# Patient Record
Sex: Male | Born: 1950 | Race: White | Hispanic: No | Smoking: Current every day smoker
Health system: Southern US, Community
[De-identification: ages and names within clinical notes are randomized; demographics above are authoritative.]

## PROBLEM LIST (undated history)

## (undated) DIAGNOSIS — F419 Anxiety disorder, unspecified: Secondary | ICD-10-CM

## (undated) DIAGNOSIS — G43909 Migraine, unspecified, not intractable, without status migrainosus: Secondary | ICD-10-CM

## (undated) DIAGNOSIS — M1990 Unspecified osteoarthritis, unspecified site: Secondary | ICD-10-CM

## (undated) DIAGNOSIS — E7800 Pure hypercholesterolemia, unspecified: Secondary | ICD-10-CM

---

## 2009-09-15 ENCOUNTER — Ambulatory Visit (HOSPITAL_COMMUNITY): Admission: RE | Admit: 2009-09-15 | Discharge: 2009-09-15 | Payer: Self-pay | Admitting: Family Medicine

## 2010-04-18 ENCOUNTER — Ambulatory Visit (HOSPITAL_COMMUNITY): Admission: RE | Admit: 2010-04-18 | Discharge: 2010-04-18 | Payer: Self-pay | Admitting: Urology

## 2010-04-28 ENCOUNTER — Ambulatory Visit (HOSPITAL_COMMUNITY)
Admission: RE | Admit: 2010-04-28 | Discharge: 2010-04-28 | Payer: Self-pay | Source: Home / Self Care | Admitting: Urology

## 2010-06-11 ENCOUNTER — Encounter: Payer: Self-pay | Admitting: Urology

## 2010-07-06 ENCOUNTER — Emergency Department (HOSPITAL_COMMUNITY)
Admission: EM | Admit: 2010-07-06 | Discharge: 2010-07-06 | Disposition: A | Discharge status: Home / Self Care | Payer: Medicare Other | Attending: Emergency Medicine | Admitting: Emergency Medicine

## 2010-07-06 DIAGNOSIS — Z79899 Other long term (current) drug therapy: Secondary | ICD-10-CM | POA: Insufficient documentation

## 2010-07-06 DIAGNOSIS — R000 Tachycardia, unspecified: Secondary | ICD-10-CM | POA: Insufficient documentation

## 2010-07-06 DIAGNOSIS — R339 Retention of urine, unspecified: Secondary | ICD-10-CM | POA: Insufficient documentation

## 2010-07-06 DIAGNOSIS — E7800 Pure hypercholesterolemia, unspecified: Secondary | ICD-10-CM | POA: Insufficient documentation

## 2010-07-06 LAB — URINALYSIS, ROUTINE W REFLEX MICROSCOPIC
Bilirubin Urine: NEGATIVE
Ketones, ur: NEGATIVE mg/dL
Nitrite: NEGATIVE
Urobilinogen, UA: 0.2 mg/dL (ref 0.0–1.0)

## 2010-08-08 ENCOUNTER — Encounter (HOSPITAL_COMMUNITY): Payer: Self-pay

## 2010-08-08 ENCOUNTER — Ambulatory Visit (HOSPITAL_COMMUNITY)
Admission: RE | Admit: 2010-08-08 | Discharge: 2010-08-08 | Disposition: A | Discharge status: Home / Self Care | Payer: Medicare Other | Source: Ambulatory Visit | Attending: Urology | Admitting: Urology

## 2010-08-08 ENCOUNTER — Other Ambulatory Visit (HOSPITAL_COMMUNITY): Payer: Self-pay | Admitting: Urology

## 2010-08-08 DIAGNOSIS — N200 Calculus of kidney: Secondary | ICD-10-CM | POA: Insufficient documentation

## 2010-08-08 DIAGNOSIS — R109 Unspecified abdominal pain: Secondary | ICD-10-CM | POA: Insufficient documentation

## 2011-01-25 ENCOUNTER — Other Ambulatory Visit (HOSPITAL_COMMUNITY): Payer: Self-pay | Admitting: Urology

## 2011-01-25 ENCOUNTER — Ambulatory Visit (HOSPITAL_COMMUNITY)
Admission: RE | Admit: 2011-01-25 | Discharge: 2011-01-25 | Disposition: A | Discharge status: Home / Self Care | Payer: Medicare Other | Source: Ambulatory Visit | Attending: Urology | Admitting: Urology

## 2011-01-25 DIAGNOSIS — N200 Calculus of kidney: Secondary | ICD-10-CM

## 2011-01-25 DIAGNOSIS — R109 Unspecified abdominal pain: Secondary | ICD-10-CM | POA: Insufficient documentation

## 2011-02-24 ENCOUNTER — Ambulatory Visit (HOSPITAL_COMMUNITY): Payer: Medicare Other

## 2011-08-02 ENCOUNTER — Other Ambulatory Visit (HOSPITAL_COMMUNITY): Payer: Self-pay | Admitting: Urology

## 2011-08-02 DIAGNOSIS — N200 Calculus of kidney: Secondary | ICD-10-CM

## 2011-08-08 ENCOUNTER — Ambulatory Visit (HOSPITAL_COMMUNITY): Payer: Medicare Other

## 2011-10-17 ENCOUNTER — Ambulatory Visit (HOSPITAL_COMMUNITY): Payer: Medicare Other

## 2011-10-27 ENCOUNTER — Ambulatory Visit (HOSPITAL_COMMUNITY): Payer: Medicare Other

## 2011-11-03 ENCOUNTER — Ambulatory Visit (HOSPITAL_COMMUNITY): Payer: Medicare Other

## 2011-11-08 DIAGNOSIS — R000 Tachycardia, unspecified: Secondary | ICD-10-CM

## 2011-11-17 ENCOUNTER — Ambulatory Visit (HOSPITAL_COMMUNITY): Payer: Medicare Other | Attending: Urology

## 2012-09-04 ENCOUNTER — Encounter (HOSPITAL_COMMUNITY): Payer: Self-pay | Admitting: Emergency Medicine

## 2012-09-04 ENCOUNTER — Emergency Department (HOSPITAL_COMMUNITY)
Admission: EM | Admit: 2012-09-04 | Discharge: 2012-09-04 | Disposition: A | Discharge status: Home / Self Care | Payer: Medicare Other | Attending: Emergency Medicine | Admitting: Emergency Medicine

## 2012-09-04 DIAGNOSIS — IMO0002 Reserved for concepts with insufficient information to code with codable children: Secondary | ICD-10-CM | POA: Insufficient documentation

## 2012-09-04 DIAGNOSIS — Y9389 Activity, other specified: Secondary | ICD-10-CM | POA: Insufficient documentation

## 2012-09-04 DIAGNOSIS — Y929 Unspecified place or not applicable: Secondary | ICD-10-CM | POA: Insufficient documentation

## 2012-09-04 DIAGNOSIS — H9209 Otalgia, unspecified ear: Secondary | ICD-10-CM | POA: Insufficient documentation

## 2012-09-04 DIAGNOSIS — T169XXA Foreign body in ear, unspecified ear, initial encounter: Secondary | ICD-10-CM | POA: Insufficient documentation

## 2012-09-04 DIAGNOSIS — F17200 Nicotine dependence, unspecified, uncomplicated: Secondary | ICD-10-CM | POA: Insufficient documentation

## 2012-09-04 DIAGNOSIS — M129 Arthropathy, unspecified: Secondary | ICD-10-CM | POA: Insufficient documentation

## 2012-09-04 DIAGNOSIS — S00452A Superficial foreign body of left ear, initial encounter: Secondary | ICD-10-CM

## 2012-09-04 DIAGNOSIS — E7800 Pure hypercholesterolemia, unspecified: Secondary | ICD-10-CM | POA: Insufficient documentation

## 2012-09-04 DIAGNOSIS — Z8679 Personal history of other diseases of the circulatory system: Secondary | ICD-10-CM | POA: Insufficient documentation

## 2012-09-04 DIAGNOSIS — F411 Generalized anxiety disorder: Secondary | ICD-10-CM | POA: Insufficient documentation

## 2012-09-04 HISTORY — DX: Unspecified osteoarthritis, unspecified site: M19.90

## 2012-09-04 HISTORY — DX: Anxiety disorder, unspecified: F41.9

## 2012-09-04 HISTORY — DX: Pure hypercholesterolemia, unspecified: E78.00

## 2012-09-04 HISTORY — DX: Migraine, unspecified, not intractable, without status migrainosus: G43.909

## 2012-09-04 NOTE — ED Provider Notes (Signed)
History     CSN: [id number]  Arrival date & time 09/04/12  2015   First MD Initiated Contact with Patient 09/04/12 2036      Chief Complaint  Patient presents with  . Foreign Body in Ear    (Consider location/radiation/quality/duration/timing/severity/associated sxs/prior treatment) HPI Comments: Patient complains of pain to his left ear after he was cleaning his ears with a Q-tip and cotton in came off in his ear. He also reports bleeding to his ear canal that began after he tried to remove the cotton himself using a pair of tweezers.  He c/o pressure in his ear with decreasing hearing.  He denies dizziness, headache, neck pain, or vomiting  Patient is a 62 y.o. male presenting with foreign body in ear. The history is provided by the patient.  Foreign Body in Ear This is a new problem. The current episode started today. The problem occurs constantly. The problem has been unchanged. Pertinent negatives include no congestion, fever, headaches, neck pain, numbness, sore throat, vomiting or weakness. Associated symptoms comments: No dizziness. Nothing aggravates the symptoms. Treatments tried: patient tried removing the cotton with a pair to tweezers. The treatment provided no relief.    Past Medical History  Diagnosis Date  . Migraines   . Arthritis   . Anxiety   . Hypercholesteremia     History reviewed. No pertinent past surgical history.  History reviewed. No pertinent family history.  History  Substance Use Topics  . Smoking status: Current Every Day Smoker  . Smokeless tobacco: Not on file  . Alcohol Use: No      Review of Systems  Constitutional: Negative for fever, activity change and appetite change.  HENT: Positive for ear pain. Negative for congestion, sore throat, trouble swallowing, neck pain and tinnitus.   Gastrointestinal: Negative for vomiting.  Neurological: Negative for dizziness, facial asymmetry, weakness, light-headedness, numbness and headaches.   All other systems reviewed and are negative.    Allergies  Doxycycline and Tetracyclines & related  Home Medications  No current outpatient prescriptions on file.  BP 129/79  Pulse 105  Temp(Src) 98.4 F (36.9 C) (Oral)  Resp 16  Ht 5\' 11"  (1.803 m)  Wt 198 lb (89.812 kg)  BMI 27.63 kg/m2  SpO2 95%  Physical Exam  Nursing note and vitals reviewed. Constitutional: He is oriented to person, place, and time. He appears well-developed and well-nourished. No distress.  HENT:  Head: Normocephalic.  Right Ear: Tympanic membrane and ear canal normal.  Left Ear: There is tenderness. No swelling. A foreign body is present. Tympanic membrane is not perforated.  Dried blood to the left ear canal with small foreign body within the canal.    Neck: Normal range of motion. Neck supple.  Cardiovascular: Normal rate, regular rhythm, normal heart sounds and intact distal pulses.   No murmur heard. Pulmonary/Chest: Effort normal and breath sounds normal. No respiratory distress.  Musculoskeletal: Normal range of motion.  Lymphadenopathy:    He has no cervical adenopathy.  Neurological: He is alert and oriented to person, place, and time. He exhibits normal muscle tone. Coordination normal.  Skin: Skin is warm and dry.    ED Course  FOREIGN BODY REMOVAL Date/Time: 09/04/2012 8:40 PM Performed by: Trisha Mangle, Lynnlee Revels L. Authorized by: Maxwell Caul Consent: Verbal consent obtained. Risks and benefits: risks, benefits and alternatives were discussed Consent given by: patient Patient identity confirmed: arm band and verbally with patient Time out: Immediately prior to procedure a "time out" was called  to verify the correct patient, procedure, equipment, support staff and site/side marked as required. Body area: ear Location details: left ear Anesthesia method: none. Patient sedated: no Patient restrained: no Patient cooperative: yes Localization method: visualized and  magnification Removal mechanism: alligator forceps Complexity: simple 1 objects recovered. Objects recovered: piece of cotton Post-procedure assessment: foreign body removed Patient tolerance: Patient tolerated the procedure well with no immediate complications.   (including critical care time)  Labs Reviewed - No data to display No results found.   1. Foreign body in ear lobe, left, initial encounter       MDM   Left ear re-evaluated after FB removal, TM is intact.  Dried blood localized to the ear canal only.  Pain improved.  Advised patient not to put anything into his ears.  He agrees to f/u with his PMD.     The patient appears reasonably screened and/or stabilized for discharge and I doubt any other medical condition or other [facility] requiring further screening, evaluation, or treatment in the ED at this time prior to discharge.     Kimberlly Norgard L. Trisha Mangle, PA-C 09/04/12 2112

## 2012-09-04 NOTE — ED Notes (Signed)
Pt states he was cleaning his ears with qtips and the cotton came off in his left ear.

## 2012-09-04 NOTE — ED Provider Notes (Signed)
Medical screening examination/treatment/procedure(s) were performed by non-physician practitioner and as supervising physician I was immediately available for consultation/collaboration.   Jalynne Persico L Quintasha Gren, MD 09/04/12 2318 

## 2013-07-15 ENCOUNTER — Other Ambulatory Visit (HOSPITAL_COMMUNITY): Payer: Self-pay

## 2013-07-15 DIAGNOSIS — J441 Chronic obstructive pulmonary disease with (acute) exacerbation: Secondary | ICD-10-CM

## 2013-07-29 ENCOUNTER — Ambulatory Visit (HOSPITAL_COMMUNITY)
Admission: RE | Admit: 2013-07-29 | Discharge: 2013-07-29 | Disposition: A | Discharge status: Home / Self Care | Payer: Medicare Other | Source: Ambulatory Visit | Attending: Pulmonary Disease | Admitting: Pulmonary Disease

## 2013-07-29 ENCOUNTER — Other Ambulatory Visit (HOSPITAL_COMMUNITY): Payer: Self-pay | Admitting: Pulmonary Disease

## 2013-07-29 DIAGNOSIS — J984 Other disorders of lung: Secondary | ICD-10-CM | POA: Insufficient documentation

## 2013-07-29 DIAGNOSIS — R0602 Shortness of breath: Secondary | ICD-10-CM

## 2013-07-29 DIAGNOSIS — J441 Chronic obstructive pulmonary disease with (acute) exacerbation: Secondary | ICD-10-CM | POA: Insufficient documentation

## 2013-07-29 LAB — PULMONARY FUNCTION TEST
DL/VA % pred: 57 %
DL/VA: 2.7 ml/min/mmHg/L
DLCO COR % PRED: 55 %
DLCO cor: 19.5 ml/min/mmHg
DLCO unc % pred: 55 %
DLCO unc: 19.5 ml/min/mmHg
FEF 25-75 POST: 3.02 L/s
FEF 25-75 Pre: 2.12 L/sec
FEF2575-%CHANGE-POST: 42 %
FEF2575-%PRED-PRE: 68 %
FEF2575-%Pred-Post: 97 %
FEV1-%Change-Post: 8 %
FEV1-%PRED-POST: 75 %
FEV1-%PRED-PRE: 68 %
FEV1-POST: 2.88 L
FEV1-Pre: 2.64 L
FEV1FVC-%CHANGE-POST: 2 %
FEV1FVC-%Pred-Pre: 102 %
FEV6-%CHANGE-POST: 7 %
FEV6-%Pred-Post: 74 %
FEV6-%Pred-Pre: 69 %
FEV6-PRE: 3.36 L
FEV6-Post: 3.63 L
FEV6FVC-%Change-Post: 1 %
FEV6FVC-%Pred-Post: 104 %
FEV6FVC-%Pred-Pre: 102 %
FVC-%Change-Post: 6 %
FVC-%PRED-POST: 72 %
FVC-%Pred-Pre: 68 %
FVC-Post: 3.67 L
FVC-Pre: 3.45 L
POST FEV1/FVC RATIO: 78 %
POST FEV6/FVC RATIO: 99 %
Pre FEV1/FVC ratio: 77 %
Pre FEV6/FVC Ratio: 97 %
RV % pred: 109 %
RV: 2.61 L
TLC % pred: 82 %
TLC: 6.09 L

## 2013-07-29 MED ORDER — ALBUTEROL SULFATE (2.5 MG/3ML) 0.083% IN NEBU
2.5000 mg | INHALATION_SOLUTION | Freq: Once | RESPIRATORY_TRACT | Status: AC
  Administered 2013-07-29: 2.5 mg via RESPIRATORY_TRACT

## 2013-08-06 NOTE — Procedures (Signed)
NAMETYRELLE, RACZKA NO.:  [id number]  MEDICAL RECORD NO.:  [id number]  LOCATION:                                 FACILITY:  PHYSICIAN:  Feleica Fulmore L. Luan Pulling, M.D.DATE OF BIRTH:  May 16, 1951  DATE OF PROCEDURE:  08/05/2013 DATE OF DISCHARGE:                           PULMONARY FUNCTION TEST   REASON FOR PULMONARY FUNCTION TESTING:  COPD exacerbation.  1. Spirometry shows a mild ventilatory defect with airflow obstruction     at the level of the smaller airways. 2. Lung volumes are normal. 3. Airway resistance is slightly elevated confirming the presence of     airflow obstruction. 4. DLCO is moderately reduced. 5. This study is consistent with the clinical diagnosis of COPD.     Dede Dobesh L. Luan Pulling, M.D.     ELH/MEDQ  D:  08/05/2013  T:  08/06/2013  Job:  [id number]

## 2013-08-08 ENCOUNTER — Encounter (HOSPITAL_COMMUNITY): Payer: Self-pay | Admitting: Emergency Medicine

## 2013-08-08 ENCOUNTER — Emergency Department (HOSPITAL_COMMUNITY)
Admission: EM | Admit: 2013-08-08 | Discharge: 2013-08-08 | Disposition: A | Discharge status: Home / Self Care | Payer: Medicare Other | Attending: Emergency Medicine | Admitting: Emergency Medicine

## 2013-08-08 ENCOUNTER — Emergency Department (HOSPITAL_COMMUNITY): Payer: Medicare Other

## 2013-08-08 DIAGNOSIS — Y9302 Activity, running: Secondary | ICD-10-CM | POA: Insufficient documentation

## 2013-08-08 DIAGNOSIS — IMO0002 Reserved for concepts with insufficient information to code with codable children: Secondary | ICD-10-CM | POA: Insufficient documentation

## 2013-08-08 DIAGNOSIS — Y929 Unspecified place or not applicable: Secondary | ICD-10-CM | POA: Insufficient documentation

## 2013-08-08 DIAGNOSIS — F17200 Nicotine dependence, unspecified, uncomplicated: Secondary | ICD-10-CM | POA: Insufficient documentation

## 2013-08-08 DIAGNOSIS — IMO0001 Reserved for inherently not codable concepts without codable children: Secondary | ICD-10-CM

## 2013-08-08 DIAGNOSIS — M79609 Pain in unspecified limb: Secondary | ICD-10-CM | POA: Insufficient documentation

## 2013-08-08 DIAGNOSIS — Z79899 Other long term (current) drug therapy: Secondary | ICD-10-CM | POA: Insufficient documentation

## 2013-08-08 DIAGNOSIS — F1310 Sedative, hypnotic or anxiolytic abuse, uncomplicated: Secondary | ICD-10-CM | POA: Insufficient documentation

## 2013-08-08 DIAGNOSIS — E7800 Pure hypercholesterolemia, unspecified: Secondary | ICD-10-CM | POA: Insufficient documentation

## 2013-08-08 DIAGNOSIS — G43909 Migraine, unspecified, not intractable, without status migrainosus: Secondary | ICD-10-CM | POA: Insufficient documentation

## 2013-08-08 DIAGNOSIS — Z8739 Personal history of other diseases of the musculoskeletal system and connective tissue: Secondary | ICD-10-CM | POA: Insufficient documentation

## 2013-08-08 DIAGNOSIS — F411 Generalized anxiety disorder: Secondary | ICD-10-CM | POA: Insufficient documentation

## 2013-08-08 DIAGNOSIS — W010XXA Fall on same level from slipping, tripping and stumbling without subsequent striking against object, initial encounter: Secondary | ICD-10-CM | POA: Insufficient documentation

## 2013-08-08 MED ORDER — ETODOLAC 200 MG PO CAPS: 200.0000 mg | ORAL_CAPSULE | Freq: Three times a day (TID) | ORAL | Status: DC

## 2013-08-08 NOTE — ED Provider Notes (Addendum)
CSN: [id number]     Arrival date & time 08/08/13  0867 History   This chart was scribed for Orpah Greek, * by Era Bumpers, ED scribe. This patient was seen in room APA10/APA10 and the patient's care was started at 0943.  Chief Complaint  Patient presents with  . Leg Pain   The history is provided by the patient. No language interpreter was used.   HPI Comments: Donald Proctor is a 63 y.o. male who presents to the Emergency Department complaining of posterior left thigh pain which radiates down his left calf, onset 3 days ago, which he attributes to a trip/fall while running x4 days ago. He states his pain worsens while bending his knee and states "the muscles feel like they are pulling." He states noticed some pain when the fall/trip occurred but it worsened when he woke up the next morning. States a small abrasion to his right knee. He denies any back/neck pain or ankle pain. He denies SOB and did not have LOC with his trip/fall.   Past Medical History  Diagnosis Date  . Migraines   . Arthritis   . Anxiety   . Hypercholesteremia    History reviewed. No pertinent past surgical history. No family history on file. History  Substance Use Topics  . Smoking status: Current Every Day Smoker  . Smokeless tobacco: Not on file  . Alcohol Use: No    Review of Systems  Constitutional: Negative for fever and chills.  Respiratory: Negative for cough and shortness of breath.   Cardiovascular: Negative for chest pain.  Gastrointestinal: Negative for nausea and vomiting.  Musculoskeletal: Negative for back pain.       Pain left posterior thigh and left posterior lower leg  Skin: Negative for rash.  All other systems reviewed and are negative.    Allergies  Doxycycline; Lipitor; and Tetracyclines & related  Home Medications   Current Outpatient Rx  Name  Route  Sig  Dispense  Refill  . ALPRAZolam (XANAX) 1 MG tablet   Oral   Take 1 mg by mouth 3 (three) times daily.          Marland Kitchen amitriptyline (ELAVIL) 50 MG tablet   Oral   Take 50 mg by mouth 3 (three) times daily.         . budesonide-formoterol (SYMBICORT) 160-4.5 MCG/ACT inhaler   Inhalation   Inhale 2 puffs into the lungs 2 (two) times daily.         . chlordiazePOXIDE (LIBRIUM) 25 MG capsule   Oral   Take 25 mg by mouth 3 (three) times daily.         . CRESTOR 10 MG tablet   Oral   Take 10 mg by mouth daily.         . metoprolol succinate (TOPROL-XL) 25 MG 24 hr tablet   Oral   Take 12.5 mg by mouth daily.         Marland Kitchen PROAIR HFA 108 (90 BASE) MCG/ACT inhaler   Inhalation   Inhale 2 puffs into the lungs 4 (four) times daily as needed.         . tamsulosin (FLOMAX) 0.4 MG CAPS capsule   Oral   Take 0.4 mg by mouth at bedtime.         Marland Kitchen tiotropium (SPIRIVA) 18 MCG inhalation capsule   Inhalation   Place 18 mcg into inhaler and inhale daily.         Marland Kitchen etodolac (LODINE) 200  MG capsule   Oral   Take 1 capsule (200 mg total) by mouth 3 (three) times daily.   20 capsule   0     Triage Vitals: BP 125/65  Pulse 95  Temp(Src) 98.1 F (36.7 C)  Resp 18  Ht 5\' 11"  (1.803 m)  Wt 210 lb (95.255 kg)  BMI 29.30 kg/m2  SpO2 94%  Physical Exam  Nursing note and vitals reviewed. Constitutional: He is oriented to person, place, and time. He appears well-developed and well-nourished. No distress.  HENT:  Head: Normocephalic and atraumatic.  Eyes: Conjunctivae are normal. Right eye exhibits no discharge. Left eye exhibits no discharge.  Neck: Normal range of motion.  Cardiovascular: Normal rate, regular rhythm and normal heart sounds.   Pulses:      Dorsalis pedis pulses are 2+ on the right side, and 2+ on the left side.  Pulmonary/Chest: Effort normal and breath sounds normal. No respiratory distress. He has no wheezes. He has no rales.  Musculoskeletal: Normal range of motion. He exhibits no edema.       Left hip: Normal. He exhibits normal range of motion, no tenderness and  no bony tenderness.       Left knee: Normal. He exhibits normal range of motion, no swelling, no effusion and no deformity. No tenderness found.       Left ankle: He exhibits normal range of motion and no swelling. No tenderness.       Lumbar back: Normal. He exhibits normal range of motion, no tenderness and no bony tenderness.       Left foot: Normal.  Neurological: He is alert and oriented to person, place, and time.  Skin: Skin is warm and dry.  Psychiatric: He has a normal mood and affect. Thought content normal.    ED Course  Procedures (including critical care time) DIAGNOSTIC STUDIES: Oxygen Saturation is 94% on room air, adequate by my interpretation.    COORDINATION OF CARE: At 1000 AM Discussed treatment plan with patient which includes [place] LLE. Patient agrees.   Labs Review Labs Reviewed - No data to display Imaging Review US Venous Img Lower Unilateral Left  08/08/2013   CLINICAL DATA:  Left leg pain and swelling following injury  EXAM: Left LOWER EXTREMITY VENOUS DOPPLER ULTRASOUND  TECHNIQUE: Gray-scale sonography with graded compression, as well as color Doppler and duplex ultrasound, were performed to evaluate the deep venous system from the level of the common femoral vein through the popliteal and proximal calf veins. Spectral Doppler was utilized to evaluate flow at rest and with distal augmentation maneuvers.  COMPARISON:  None.  FINDINGS: Thrombus within deep veins:  None visualized.  Compressibility of deep veins:  Normal.  Duplex waveform respiratory phasicity:  Normal.  Duplex waveform response to augmentation:  Normal.  Venous reflux:  None visualized.  Other findings:  None visualized.  IMPRESSION: No evidence of deep venous thrombosis is noted in the left leg.   Electronically Signed   By: Inez Catalina M.D.   On: 08/08/2013 11:13     EKG Interpretation None      MDM   Final diagnoses:  Strain    The patient presented several days after a fall with  complaints of pain in the entire left leg. He has not experienced any back pain. This does not appear to be a radiculopathy. Area of pain is in the hamstring area as well as calf area. Careful palpation of the left hip, left knee, left ankle and  foot reveal no bony tenderness. There is no swelling or deformity. He has normal strength and sensation. Pulses normal. Ultrasound is performed to rule out DVT. No DVT was seen. Patient seemed to feel like he required x-rays of his entire leg. As there was no bony tenderness and he is able to ambulate, I did not feel like this was necessary. She became quite agitated I did not do the x-ray and Aleve. He was pacing around the ER stating "I shouldn't have done more head". He was given discharge instructions for muscle strain. He can followup with his primary care physician, Doctor Bluth.  I personally performed the services described in this documentation, which was scribed in my presence. The recorded information has been reviewed and is accurate.     Orpah Greek, MD 08/08/13 [place], MD 08/08/13 1537

## 2013-08-08 NOTE — ED Notes (Signed)
Pt upset at discharge stating, "I should have went to [facility]."  Pt feels like nothing was done for him.  Discharge instructions reviewed and rx reviewed.  Pt initially refused instructions and rx but after encouragement, pt took papers and instructions.  Pt did sign e-sig but signature pad was not working at that time.  Pt offered wheelchair but refused.

## 2013-08-08 NOTE — ED Notes (Signed)
Pt c/o lle pain since tripping and falling in home on Tuesday.

## 2013-08-08 NOTE — Discharge Instructions (Signed)
Muscle Strain  A muscle strain (pulled muscle) happens when a muscle is stretched beyond normal length. It happens when a sudden, violent force stretches your muscle too far. Usually, a few of the fibers in your muscle are torn. Muscle strain is common in athletes. Recovery usually takes 1 2 weeks. Complete healing takes 5 6 weeks.   HOME CARE    Follow the PRICE method of treatment to help your injury get better. Do this the first 2 3 days after the injury:   Protect. Protect the muscle to keep it from getting injured again.   Rest. Limit your activity and rest the injured body part.   Ice. Put ice in a plastic bag. Place a towel between your skin and the bag. Then, apply the ice and leave it on from 15 20 minutes each hour. After the third day, switch to moist heat packs.   Compression. Use a splint or elastic bandage on the injured area for comfort. Do not put it on too tightly.   Elevate. Keep the injured body part above the level of your heart.   Only take medicine as told by your doctor.   Warm up before doing exercise to prevent future muscle strains.  GET HELP IF:    You have more pain or puffiness (swelling) in the injured area.   You feel numbness, tingling, or notice a loss of strength in the injured area.  MAKE SURE YOU:    Understand these instructions.   Will watch your condition.   Will get help right away if you are not doing well or get worse.  Document Released: 02/15/2008 Document Revised: 02/26/2013 Document Reviewed: 12/05/2012  [employer] Patient Information 2014 [employer]

## 2013-11-18 ENCOUNTER — Other Ambulatory Visit (HOSPITAL_COMMUNITY): Payer: Self-pay | Admitting: Pulmonary Disease

## 2013-11-18 DIAGNOSIS — R918 Other nonspecific abnormal finding of lung field: Secondary | ICD-10-CM

## 2013-12-03 ENCOUNTER — Ambulatory Visit (HOSPITAL_COMMUNITY)
Admission: RE | Admit: 2013-12-03 | Discharge: 2013-12-03 | Disposition: A | Discharge status: Home / Self Care | Payer: Medicare Other | Source: Ambulatory Visit | Attending: Pulmonary Disease | Admitting: Pulmonary Disease

## 2013-12-03 ENCOUNTER — Encounter (HOSPITAL_COMMUNITY): Payer: Self-pay

## 2013-12-03 DIAGNOSIS — I2584 Coronary atherosclerosis due to calcified coronary lesion: Secondary | ICD-10-CM | POA: Diagnosis not present

## 2013-12-03 DIAGNOSIS — D3500 Benign neoplasm of unspecified adrenal gland: Secondary | ICD-10-CM | POA: Diagnosis not present

## 2013-12-03 DIAGNOSIS — R918 Other nonspecific abnormal finding of lung field: Secondary | ICD-10-CM | POA: Diagnosis not present

## 2013-12-03 LAB — POCT I-STAT CREATININE: CREATININE: 1.2 mg/dL (ref 0.50–1.35)

## 2013-12-03 MED ORDER — IOHEXOL 300 MG/ML  SOLN
80.0000 mL | Freq: Once | INTRAMUSCULAR | Status: AC | PRN
  Administered 2013-12-03: 80 mL via INTRAVENOUS

## 2013-12-12 ENCOUNTER — Encounter (HOSPITAL_COMMUNITY): Payer: Self-pay | Admitting: Emergency Medicine

## 2013-12-12 ENCOUNTER — Emergency Department (HOSPITAL_COMMUNITY)
Admission: EM | Admit: 2013-12-12 | Discharge: 2013-12-12 | Disposition: A | Discharge status: Home / Self Care | Payer: Medicare Other | Attending: Emergency Medicine | Admitting: Emergency Medicine

## 2013-12-12 ENCOUNTER — Emergency Department (HOSPITAL_COMMUNITY): Payer: Medicare Other

## 2013-12-12 DIAGNOSIS — Z79899 Other long term (current) drug therapy: Secondary | ICD-10-CM | POA: Insufficient documentation

## 2013-12-12 DIAGNOSIS — M79609 Pain in unspecified limb: Secondary | ICD-10-CM | POA: Insufficient documentation

## 2013-12-12 DIAGNOSIS — F411 Generalized anxiety disorder: Secondary | ICD-10-CM | POA: Diagnosis not present

## 2013-12-12 DIAGNOSIS — M129 Arthropathy, unspecified: Secondary | ICD-10-CM | POA: Insufficient documentation

## 2013-12-12 DIAGNOSIS — F17200 Nicotine dependence, unspecified, uncomplicated: Secondary | ICD-10-CM | POA: Insufficient documentation

## 2013-12-12 DIAGNOSIS — G43909 Migraine, unspecified, not intractable, without status migrainosus: Secondary | ICD-10-CM | POA: Insufficient documentation

## 2013-12-12 DIAGNOSIS — M79605 Pain in left leg: Secondary | ICD-10-CM

## 2013-12-12 MED ORDER — KETOROLAC TROMETHAMINE 60 MG/2ML IM SOLN
60.0000 mg | Freq: Once | INTRAMUSCULAR | Status: AC
  Administered 2013-12-12: 60 mg via INTRAMUSCULAR
  Filled 2013-12-12: qty 2

## 2013-12-12 MED ORDER — ACETAMINOPHEN-CODEINE #3 300-30 MG PO TABS: 1.0000 | ORAL_TABLET | Freq: Four times a day (QID) | ORAL | Status: AC | PRN

## 2013-12-12 MED ORDER — ETODOLAC 200 MG PO CAPS: 200.0000 mg | ORAL_CAPSULE | Freq: Three times a day (TID) | ORAL | Status: AC

## 2013-12-12 NOTE — ED Notes (Addendum)
Pt. States his leg has been hurting since March, was seen here in March and was not happy with his care, still has discharge papers with prescription not filled from March, states leg "feels like it is being twisted" c/o dull constant ache

## 2013-12-12 NOTE — ED Provider Notes (Signed)
CSN: [id number]     Arrival date & time 12/12/13  0941 History   First MD Initiated Contact with Patient 12/12/13 [phone]     Chief Complaint  Patient presents with  . Leg Pain     (Consider location/radiation/quality/duration/timing/severity/associated sxs/prior Treatment) HPI   Donald Proctor is a 63 y.o. male  Presenting for further evaluation of left leg pain which has been present for the past 4 months.  He describes waking one morning with pain from his left medial knee which radiates to his left groin and into his buttock which waxes and wanes but does not resolve. The pain is dull and describes feeling like he needs to stretch the leg out, but this action does not relieve his pain.  He has taken no medications and treatment for this condition.  He was seen here in March for this complaint at which time an [place] to rule out dvt was negative.  He has not followed up with his pcp for this problem.  He denies injury or falls and denies weakness , numbness, rash or swelling in the extremity.  He does report occasional low back pain which is not bothering him today.    Past Medical History  Diagnosis Date  . Migraines   . Arthritis   . Anxiety   . Hypercholesteremia    History reviewed. No pertinent past surgical history. History reviewed. No pertinent family history. History  Substance Use Topics  . Smoking status: Current Every Day Smoker  . Smokeless tobacco: Not on file  . Alcohol Use: No    Review of Systems  Constitutional: Negative for fever.  Musculoskeletal: Positive for arthralgias and joint swelling. Negative for myalgias.  Neurological: Negative for weakness and numbness.      Allergies  Doxycycline; Lipitor; and Tetracyclines & related  Home Medications   Prior to Admission medications   Medication Sig Start Date End Date Taking? Authorizing Provider  ALPRAZolam Duanne Moron) 1 MG tablet Take 1 mg by mouth 3 (three) times daily. 07/28/13  Yes Historical Provider, MD   amitriptyline (ELAVIL) 50 MG tablet Take 50 mg by mouth 3 (three) times daily. 07/18/13  Yes Historical Provider, MD  budesonide-formoterol (SYMBICORT) 160-4.5 MCG/ACT inhaler Inhale 2 puffs into the lungs 2 (two) times daily.   Yes Historical Provider, MD  chlordiazePOXIDE (LIBRIUM) 25 MG capsule Take 25 mg by mouth 3 (three) times daily. 07/07/13  Yes Historical Provider, MD  CRESTOR 10 MG tablet Take 10 mg by mouth at bedtime.  07/07/13  Yes Historical Provider, MD  metoprolol succinate (TOPROL-XL) 25 MG 24 hr tablet Take 12.5 mg by mouth daily. 07/14/13  Yes Historical Provider, MD  tamsulosin (FLOMAX) 0.4 MG CAPS capsule Take 0.4 mg by mouth at bedtime. 08/02/13  Yes Historical Provider, MD  Tiotropium Bromide Monohydrate (SPIRIVA RESPIMAT) 2.5 MCG/ACT AERS Inhale 2 mcg/m2/day into the lungs daily.   Yes Historical Provider, MD  acetaminophen-codeine (TYLENOL #3) 300-30 MG per tablet Take 1 tablet by mouth every 6 (six) hours as needed for moderate pain. 12/12/13   Evalee Jefferson, PA-C  etodolac (LODINE) 200 MG capsule Take 1 capsule (200 mg total) by mouth 3 (three) times daily. 12/12/13   Evalee Jefferson, PA-C  PROAIR HFA 108 (90 BASE) MCG/ACT inhaler Inhale 2 puffs into the lungs 4 (four) times daily as needed. 07/18/13   Historical Provider, MD   There were no vitals taken for this visit. Physical Exam  Constitutional: He appears well-developed and well-nourished.  HENT:  Head:  Atraumatic.  Neck: Normal range of motion.  Cardiovascular:  Pulses:      Dorsalis pedis pulses are 2+ on the right side, and 2+ on the left side.  Pulses equal bilaterally  Musculoskeletal: He exhibits tenderness. He exhibits no edema.  ttp left medial knee and medial thigh.  No edema,  No palpable cords, no rash or erythema.  Lumbar spine nontender.  Pain is increased with left hip internal rotation.    Neurological: He is alert. He has normal strength. He displays normal reflexes. No sensory deficit.  Skin: Skin is warm  and dry.  Psychiatric: He has a normal mood and affect.    ED Course  Procedures (including critical care time) Labs Review Labs Reviewed - No data to display  Imaging Review Dg Hip Complete Left  12/12/2013   CLINICAL DATA:  63 year old male with pain. No known injury. Initial encounter.  EXAM: LEFT HIP - COMPLETE 2+ VIEW  COMPARISON:  [facility] CT Abdomen and Pelvis 04/28/2013  FINDINGS: External probably clothing artifact projects over the lower pelvis and hips on the AP view of the pelvis. Bone mineralization is within normal limits for age. Femoral heads normally located. Hip joint spaces preserved. Bony pelvis intact. sacral ala and SI joints within normal limits. Grossly intact proximal right femur. Proximal left femur intact.  IMPRESSION: Negative for age radiographic appearance of the left hip and pelvis.   Electronically Signed   By: Lars Pinks M.D.   On: 12/12/2013 10:50   Dg Knee Complete 4 Views Left  12/12/2013   CLINICAL DATA:  Left hip and knee pain  EXAM: LEFT KNEE - COMPLETE 4+ VIEW  COMPARISON:  None.  FINDINGS: There is no evidence of fracture, dislocation, or joint effusion. There is no evidence of arthropathy or other focal bone abnormality. Soft tissues are unremarkable.  IMPRESSION: No acute osseous injury of the left knee.   Electronically Signed   By: Kathreen Devoid   On: 12/12/2013 10:51     EKG Interpretation None      MDM   Final diagnoses:  Left leg pain    Chronic leg pain of unclear etiology.  Sx for the past 4 months.  Previous visit for this reviewed.   Patients labs and/or radiological studies were viewed and considered during the medical decision making and disposition process. No obvious source of pain,  Possible radicular pain, pt describes intermittent low back pain, but not currently.  Possible bursitis/muscle or tendon strain/sprain.  Prescribed etolodac (was prescribed this at last visit here for this condition but never filled.   Tylenol #3.  Heat tx.  F/u with pcp in 1 week for recheck.    Evalee Jefferson, PA-C 12/12/13 1156

## 2013-12-12 NOTE — Discharge Instructions (Signed)
Musculoskeletal Pain Musculoskeletal pain is muscle and boney aches and pains. These pains can occur in any part of the body. Your caregiver may treat you without knowing the cause of the pain. They may treat you if blood or urine tests, X-rays, and other tests were normal.  CAUSES There is often not a definite cause or reason for these pains. These pains may be caused by a type of germ (virus). The discomfort may also come from overuse. Overuse includes working out too hard when your body is not fit. Boney aches also come from weather changes. Bone is sensitive to atmospheric pressure changes. HOME CARE INSTRUCTIONS   Ask when your test results will be ready. Make sure you get your test results.  Only take over-the-counter or prescription medicines for pain, discomfort, or fever as directed by your caregiver. If you were given medications for your condition, do not drive, operate machinery or power tools, or sign legal documents for 24 hours. Do not drink alcohol. Do not take sleeping pills or other medications that may interfere with treatment.  Continue all activities unless the activities cause more pain. When the pain lessens, slowly resume normal activities. Gradually increase the intensity and duration of the activities or exercise.  During periods of severe pain, bed rest may be helpful. Lay or sit in any position that is comfortable.  Putting ice on the injured area.  Put ice in a bag.  Place a towel between your skin and the bag.  Leave the ice on for 15 to 20 minutes, 3 to 4 times a day.  Follow up with your caregiver for continued problems and no reason can be found for the pain. If the pain becomes worse or does not go away, it may be necessary to repeat tests or do additional testing. Your caregiver may need to look further for a possible cause. SEEK IMMEDIATE MEDICAL CARE IF:  You have pain that is getting worse and is not relieved by medications.  You develop chest pain  that is associated with shortness or breath, sweating, feeling sick to your stomach (nauseous), or throw up (vomit).  Your pain becomes localized to the abdomen.  You develop any new symptoms that seem different or that concern you. MAKE SURE YOU:   Understand these instructions.  Will watch your condition.  Will get help right away if you are not doing well or get worse. Document Released: 05/08/2005 Document Revised: 07/31/2011 Document Reviewed: 01/10/2013 [facility] Patient Information 2015 [place], [place]. This information is not intended to replace advice given to you by your health care provider. Make sure you discuss any questions you have with your health care provider.    Use the medicines prescribed for your pain and for inflammation (lodine).  Use a heating pad to your knee and hip - 20 minutes several times daily.  You have been prescribed tylenol #3 in place of the ultram which was originally discussed as ultram can interfere with your amitriptyline.  Use caution with this medicine as it can make you sleepy.  Your xrays are negative today with no obvious source found for your pain.

## 2013-12-12 NOTE — ED Provider Notes (Signed)
Medical screening examination/treatment/procedure(s) were performed by non-physician practitioner and as supervising physician I was immediately available for consultation/collaboration.   Dorie Rank, MD 12/12/13 1201

## 2015-07-21 DEATH — deceased

## 2016-07-16 IMAGING — CR DG KNEE COMPLETE 4+V*L*
4 series · 4 of 4 positions shown · non-contrast
Comparison: None.

CLINICAL DATA: Left hip and knee pain

EXAM:
LEFT KNEE - COMPLETE 4+ VIEW

[view not recorded (1 of 4)]
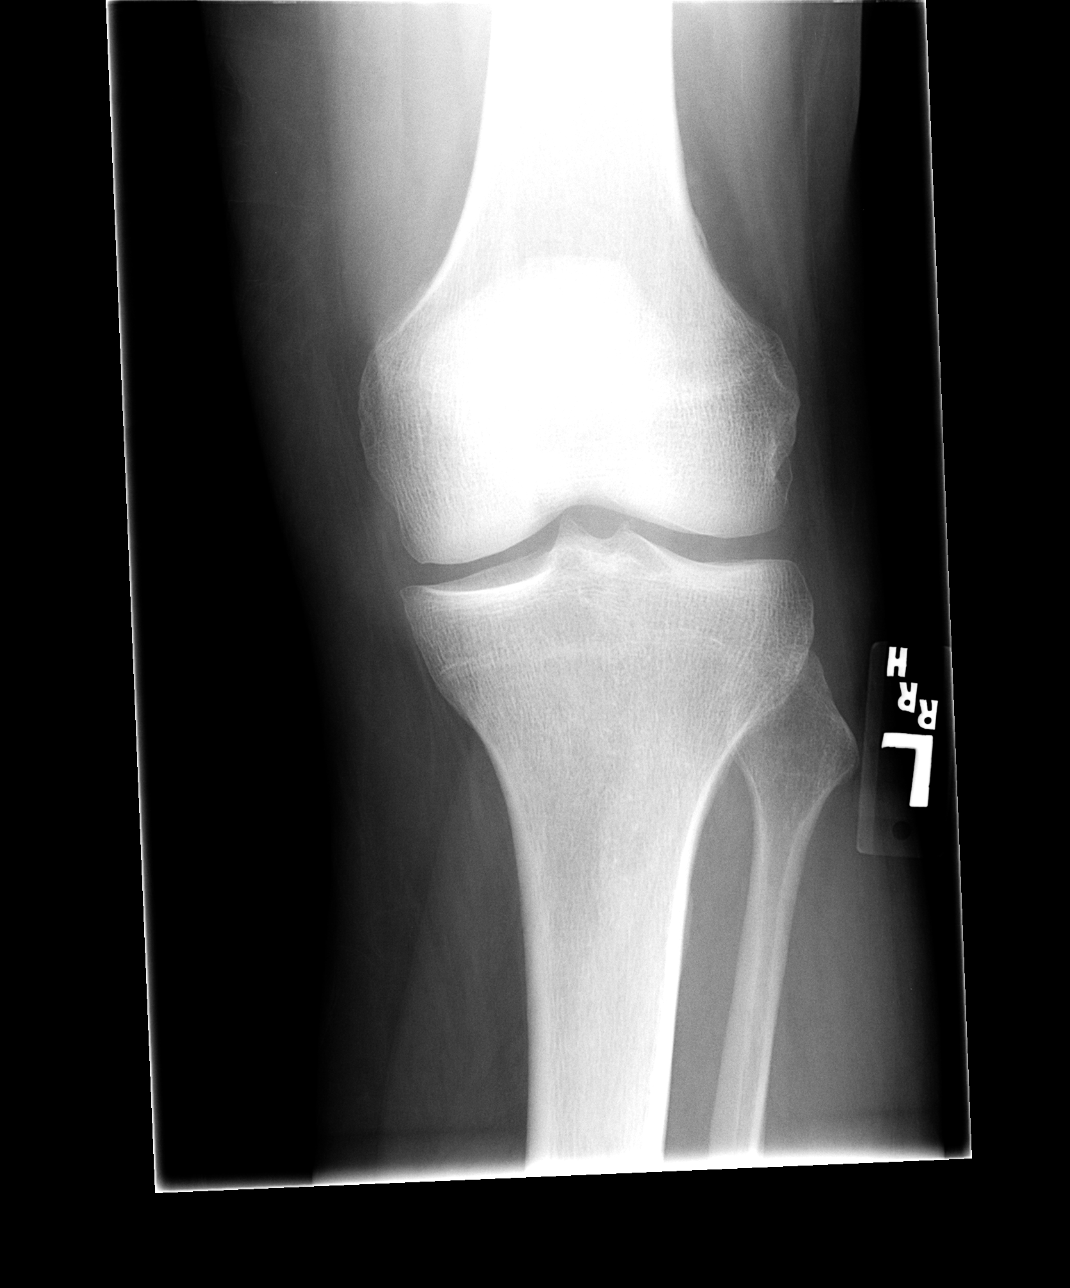

[view not recorded (2 of 4)]
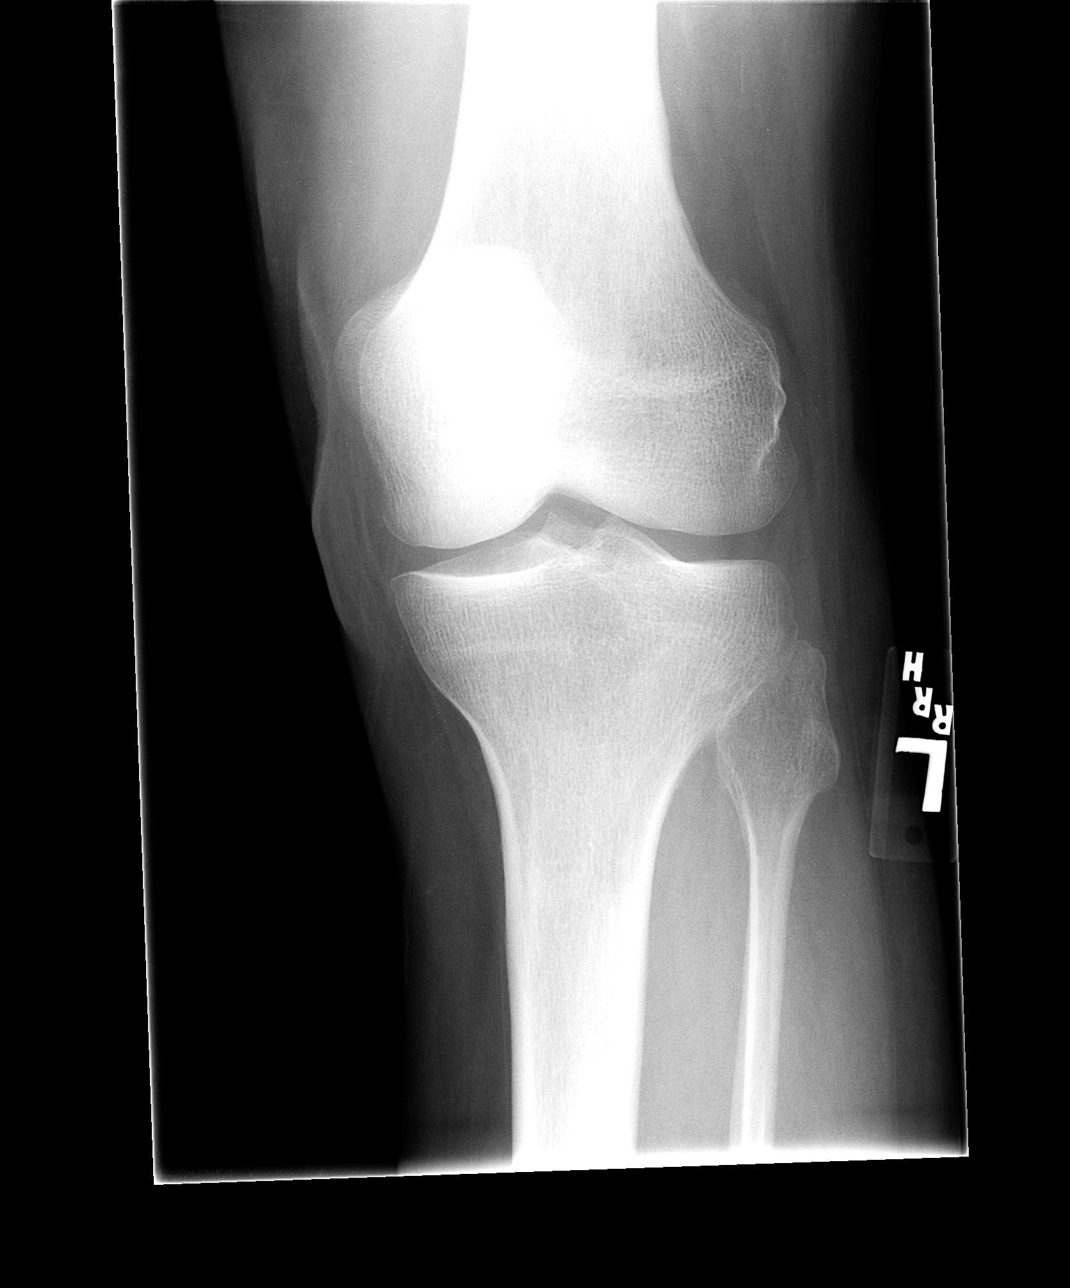

[view not recorded (3 of 4)]
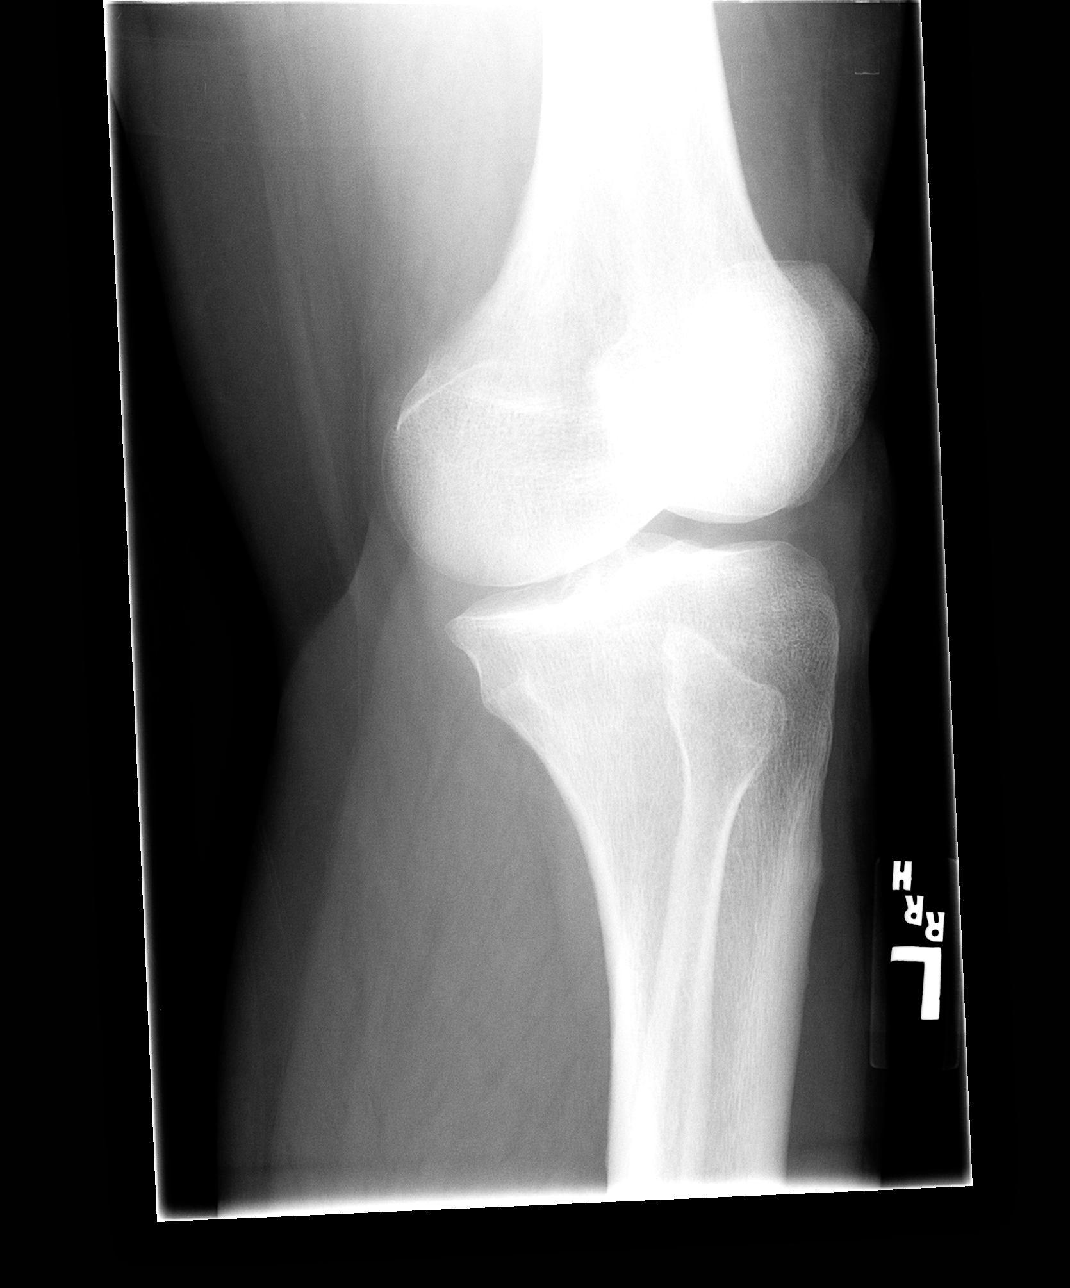

[view not recorded (4 of 4)]
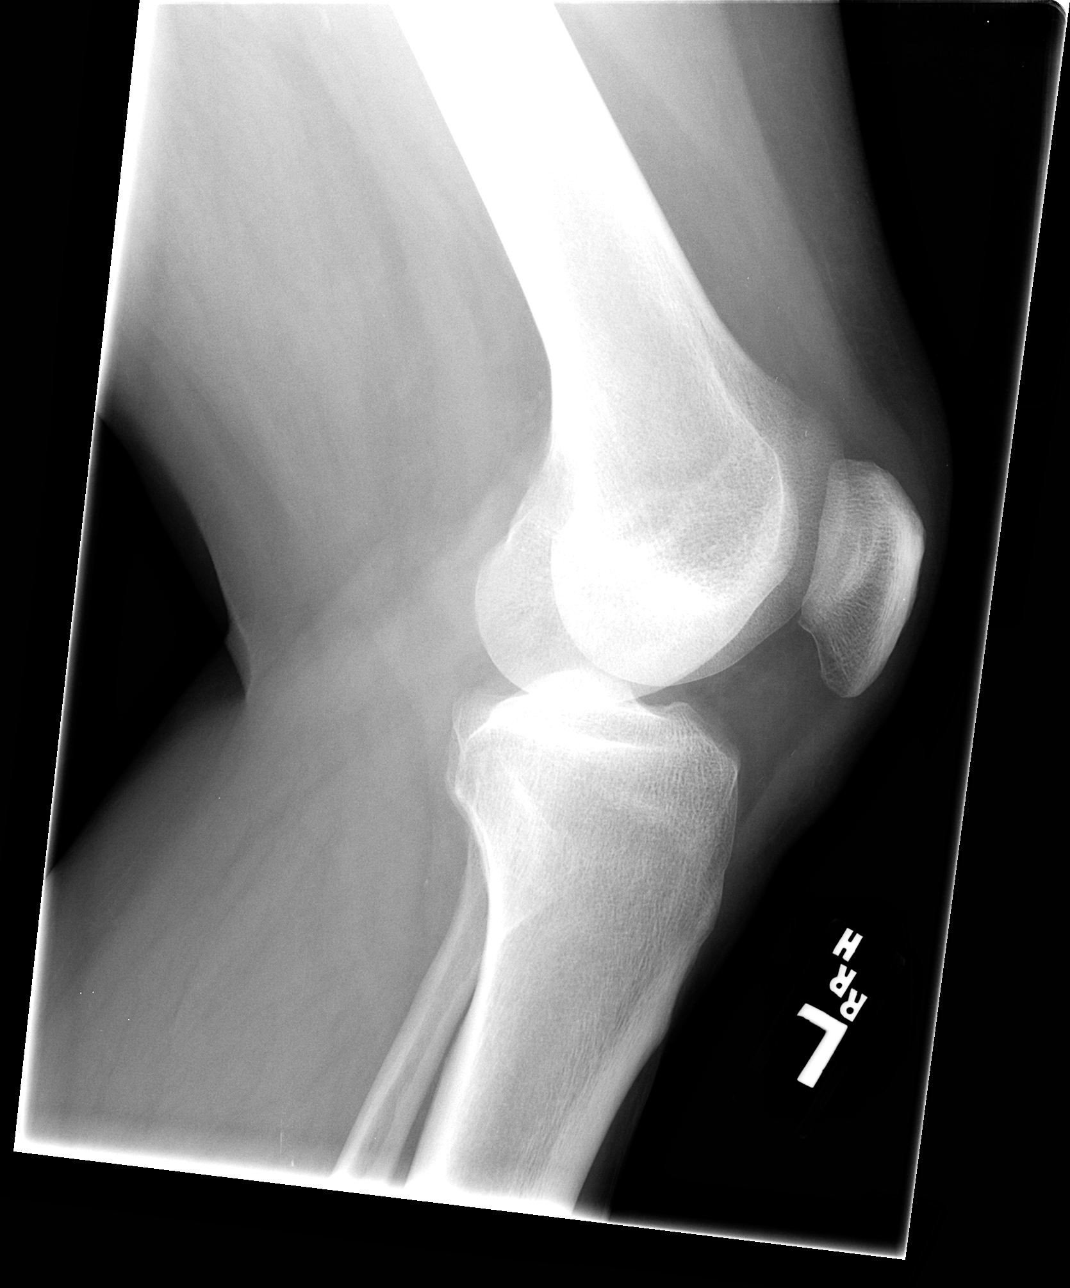

[4 of 4 positions shown; findings below may reference images not displayed]

FINDINGS: There is no evidence of fracture, dislocation, or joint effusion.
There is no evidence of arthropathy or other focal bone abnormality.
Soft tissues are unremarkable.
IMPRESSION: No acute osseous injury of the left knee.
